# Patient Record
Sex: Female | Born: 1955 | Race: Black or African American | Hispanic: No | Marital: Married | Smoking: Never smoker
Health system: Southern US, Community
[De-identification: ages and names within clinical notes are randomized; demographics above are authoritative.]

## PROBLEM LIST (undated history)

## (undated) DIAGNOSIS — I10 Essential (primary) hypertension: Secondary | ICD-10-CM

---

## 2000-12-30 ENCOUNTER — Other Ambulatory Visit: Admission: RE | Admit: 2000-12-30 | Discharge: 2000-12-30 | Payer: Self-pay | Admitting: Obstetrics and Gynecology

## 2005-10-14 ENCOUNTER — Observation Stay (HOSPITAL_COMMUNITY): Admission: RE | Admit: 2005-10-14 | Discharge: 2005-10-15 | Payer: Self-pay | Admitting: Obstetrics and Gynecology

## 2005-11-02 ENCOUNTER — Emergency Department (HOSPITAL_COMMUNITY): Admission: EM | Admit: 2005-11-02 | Discharge: 2005-11-02 | Payer: Self-pay | Admitting: Emergency Medicine

## 2018-02-25 ENCOUNTER — Emergency Department (HOSPITAL_COMMUNITY)
Admission: EM | Admit: 2018-02-25 | Discharge: 2018-02-25 | Disposition: A | Discharge status: Home / Self Care | Payer: Self-pay | Attending: Emergency Medicine | Admitting: Emergency Medicine

## 2018-02-25 ENCOUNTER — Other Ambulatory Visit: Payer: Self-pay

## 2018-02-25 ENCOUNTER — Emergency Department (HOSPITAL_COMMUNITY): Payer: Self-pay

## 2018-02-25 ENCOUNTER — Encounter (HOSPITAL_COMMUNITY): Payer: Self-pay | Admitting: Emergency Medicine

## 2018-02-25 DIAGNOSIS — M5441 Lumbago with sciatica, right side: Secondary | ICD-10-CM | POA: Insufficient documentation

## 2018-02-25 DIAGNOSIS — I10 Essential (primary) hypertension: Secondary | ICD-10-CM | POA: Insufficient documentation

## 2018-02-25 HISTORY — DX: Essential (primary) hypertension: I10

## 2018-02-25 LAB — URINALYSIS, ROUTINE W REFLEX MICROSCOPIC
BILIRUBIN URINE: NEGATIVE
Glucose, UA: NEGATIVE mg/dL
HGB URINE DIPSTICK: NEGATIVE
KETONES UR: NEGATIVE mg/dL
Leukocytes, UA: NEGATIVE
Nitrite: NEGATIVE
PROTEIN: NEGATIVE mg/dL
SPECIFIC GRAVITY, URINE: 1.012 (ref 1.005–1.030)
pH: 8 (ref 5.0–8.0)

## 2018-02-25 MED ORDER — CYCLOBENZAPRINE HCL 10 MG PO TABS
10.0000 mg | ORAL_TABLET | Freq: Once | ORAL | Status: AC
  Administered 2018-02-25: 10 mg via ORAL
  Filled 2018-02-25: qty 1

## 2018-02-25 MED ORDER — CYCLOBENZAPRINE HCL 10 MG PO TABS: 10.0000 mg | ORAL_TABLET | Freq: Three times a day (TID) | ORAL | 0 refills | Status: AC | PRN

## 2018-02-25 MED ORDER — PREDNISONE 10 MG PO TABS: ORAL_TABLET | ORAL | 0 refills | Status: AC

## 2018-02-25 MED ORDER — DEXAMETHASONE SODIUM PHOSPHATE 4 MG/ML IJ SOLN
10.0000 mg | Freq: Once | INTRAMUSCULAR | Status: AC
  Administered 2018-02-25: 10 mg via INTRAMUSCULAR
  Filled 2018-02-25: qty 3

## 2018-02-25 NOTE — ED Notes (Signed)
Patient seen and evaluated by EDPa for initial assessment. 

## 2018-02-25 NOTE — Discharge Instructions (Addendum)
Alternate ice and heat to your lower back.  Try to avoid excessive bending and twisting movements.  Return to the ER for any worsening symptoms such as fever, chills,  incontinence, and increasing pain or numbness of your legs.

## 2018-02-25 NOTE — ED Triage Notes (Addendum)
Pt c/o of lower back pain with left leg pain.  HX of lower back pain

## 2018-02-28 NOTE — ED Provider Notes (Signed)
[facility] EMERGENCY DEPARTMENT Provider Note   CSN: [id number] Arrival date & time: 02/25/18  1548     History   Chief Complaint Chief Complaint  Patient presents with  . Back Pain    HPI Megan Mccall is a 62 y.o. female.  HPI   Megan Mccall is a 62 y.o. female with hx of low back pain, presents to the Emergency Department complaining of low back pain with pain radiating into the right leg.  Pain worsening for several days.  Pain is similar to previous and associated with movement.  Improves slightly at rest.  Denies recent injury or new symptoms.  No abdominal pain, urine or bowel changes, fever, numbness or weakness of the lower extremities.      Past Medical History:  Diagnosis Date  . Hypertension     There are no active problems to display for this patient.   Past Surgical History:  Procedure Laterality Date  . CESAREAN SECTION       OB History   None      Home Medications    Prior to Admission medications   Medication Sig Start Date End Date Taking? Authorizing Provider  cyclobenzaprine (FLEXERIL) 10 MG tablet Take 1 tablet (10 mg total) by mouth 3 (three) times daily as needed. 02/25/18   Amparo Donalson, PA-C  predniSONE (DELTASONE) 10 MG tablet Take 6 tablets day one, 5 tablets day two, 4 tablets day three, 3 tablets day four, 2 tablets day five, then 1 tablet day six 02/25/18   Pauline Aus, PA-C    Family History No family history on file.  Social History Social History   Tobacco Use  . Smoking status: Never Smoker  . Smokeless tobacco: Never Used  Substance Use Topics  . Alcohol use: Never    Frequency: Never  . Drug use: Never     Allergies   Patient has no allergy information on record.   Review of Systems Review of Systems  Constitutional: Negative for fever.  Respiratory: Negative for shortness of breath.   Gastrointestinal: Negative for abdominal pain, constipation and vomiting.  Genitourinary: Negative  for decreased urine volume, difficulty urinating, dysuria and flank pain.  Musculoskeletal: Positive for back pain. Negative for joint swelling.  Skin: Negative for rash.  Neurological: Negative for weakness and numbness.  All other systems reviewed and are negative.    Physical Exam Updated Vital Signs BP (!) 161/95 (BP Location: Right Arm)   Pulse 75   Temp 98.5 F (36.9 C) (Oral)   Resp 20   Ht 5\' 6"  (1.676 m)   Wt 88.5 kg (195 lb)   SpO2 99%   BMI 31.47 kg/m   Physical Exam  Constitutional: She is oriented to person, place, and time. She appears well-developed and well-nourished. No distress.  HENT:  Head: Normocephalic and atraumatic.  Neck: Normal range of motion. Neck supple.  Cardiovascular: Normal rate, regular rhythm and intact distal pulses.  DP pulses are strong and palpable bilaterally  Pulmonary/Chest: Effort normal and breath sounds normal. No respiratory distress.  Abdominal: Soft. She exhibits no distension. There is no tenderness.  Musculoskeletal: She exhibits tenderness. She exhibits no edema.       Lumbar back: She exhibits tenderness and pain. She exhibits normal range of motion, no swelling, no deformity, no laceration and normal pulse.  ttp of the lower right lumbar paraspinal muscles and SI joint space.  No spinal tenderness.  Pt has 5/5 strength against resistance of bilateral lower  extremities.     Neurological: She is alert and oriented to person, place, and time. She has normal strength. No sensory deficit. She exhibits normal muscle tone. Coordination and gait normal.  Reflex Scores:      Patellar reflexes are 2+ on the right side and 2+ on the left side.      Achilles reflexes are 2+ on the right side and 2+ on the left side. Skin: Skin is warm and dry. Capillary refill takes less than 2 seconds. No rash noted.  Nursing note and vitals reviewed.    ED Treatments / Results  Labs (all labs ordered are listed, but only abnormal results are  displayed) Labs Reviewed  URINALYSIS, ROUTINE W REFLEX MICROSCOPIC - Abnormal; Notable for the following components:      Result Value   APPearance CLOUDY (*)    All other components within normal limits    EKG None  Radiology No results found.  Procedures Procedures (including critical care time)  Medications Ordered in ED Medications  dexamethasone (DECADRON) injection 10 mg (10 mg Intramuscular Given 02/25/18 1717)  cyclobenzaprine (FLEXERIL) tablet 10 mg (10 mg Oral Given 02/25/18 1716)     Initial Impression / Assessment and Plan / ED Course  I have reviewed the triage vital signs and the nursing notes.  Pertinent labs & imaging results that were available during my care of the patient were reviewed by me and considered in my medical decision making (see chart for details).     Pt well appearing, ambulates with steady gait.  No focal neuro deficits.  Likely acute on chronic low back pain with sciatica.  Pt agrees to plan with close PCP f/u if needed and rx flexeril and prednisone.   Final Clinical Impressions(s) / ED Diagnoses   Final diagnoses:  Acute right-sided low back pain with right-sided sciatica    ED Discharge Orders        Ordered    cyclobenzaprine (FLEXERIL) 10 MG tablet  3 times daily PRN     02/25/18 1913    predniSONE (DELTASONE) 10 MG tablet     02/25/18 1913       Pauline Ausriplett, Caoimhe Damron, PA-C 02/28/18 2327    Bethann BerkshireZammit, Joseph, MD 03/01/18 1620

## 2019-10-01 IMAGING — DX DG LUMBAR SPINE COMPLETE 4+V
5 series · 5 of 5 positions shown · non-contrast
Comparison: None.

CLINICAL DATA: Lower back pain worsening since 02/08/2018.

EXAM:
LUMBAR SPINE - COMPLETE 4+ VIEW

[l-spine ap]
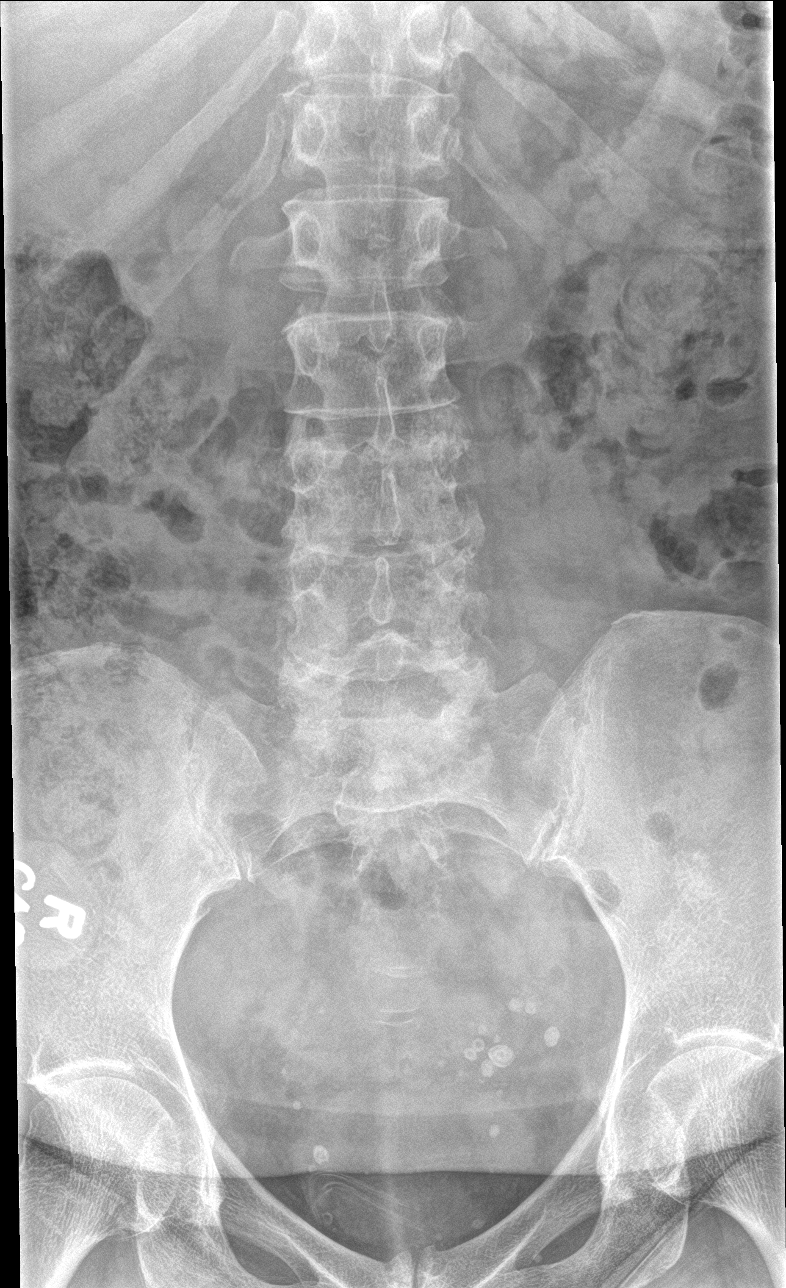

[l-spine obl (1 of 2)]
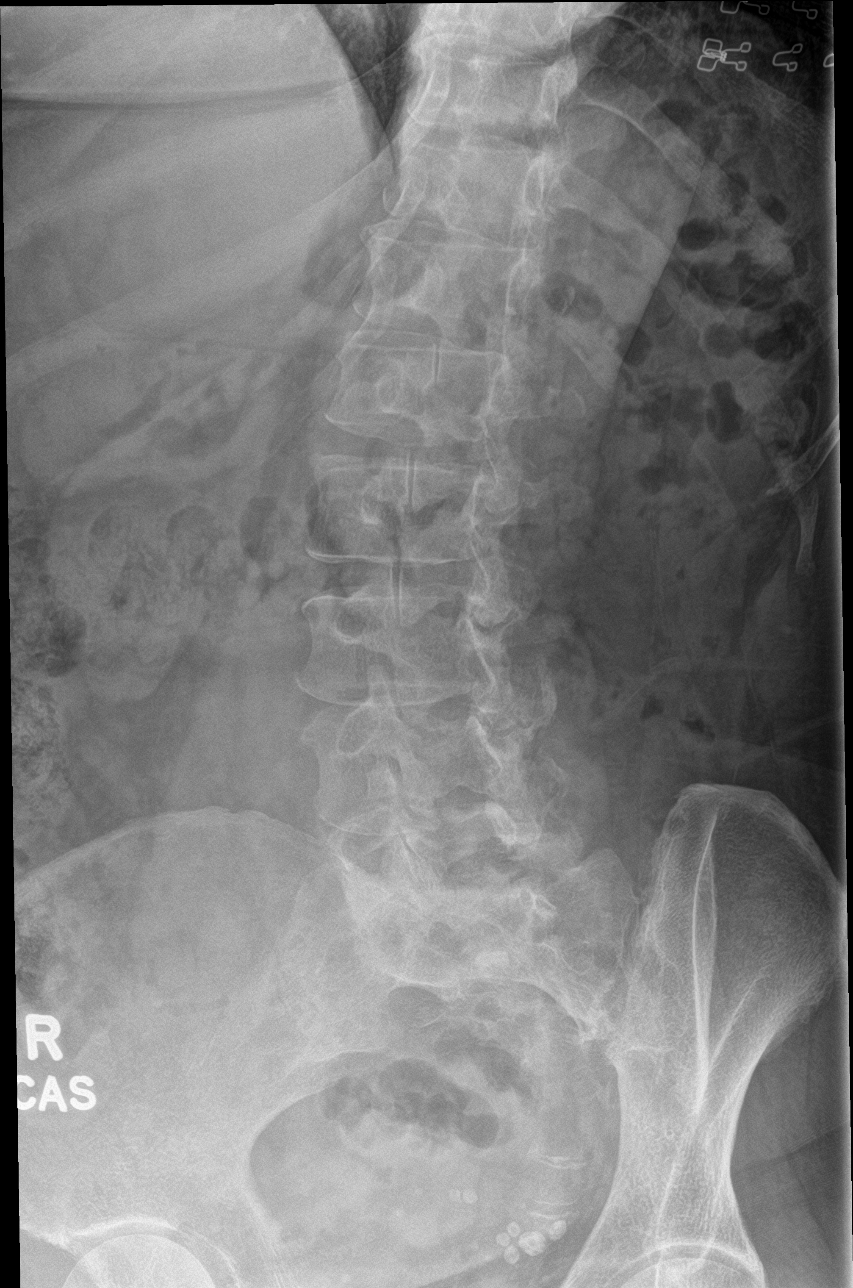

[l-spine obl (2 of 2)]
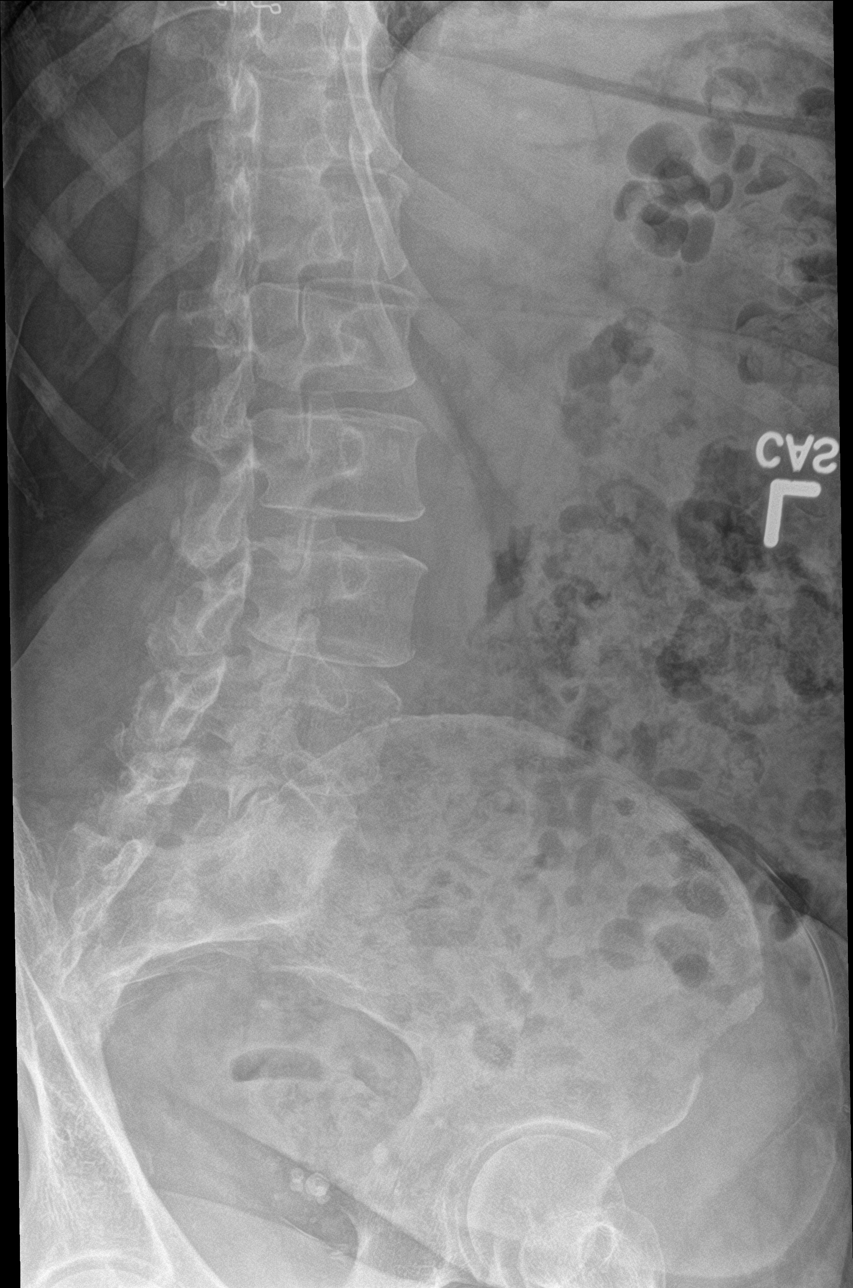

[l-spine lat]
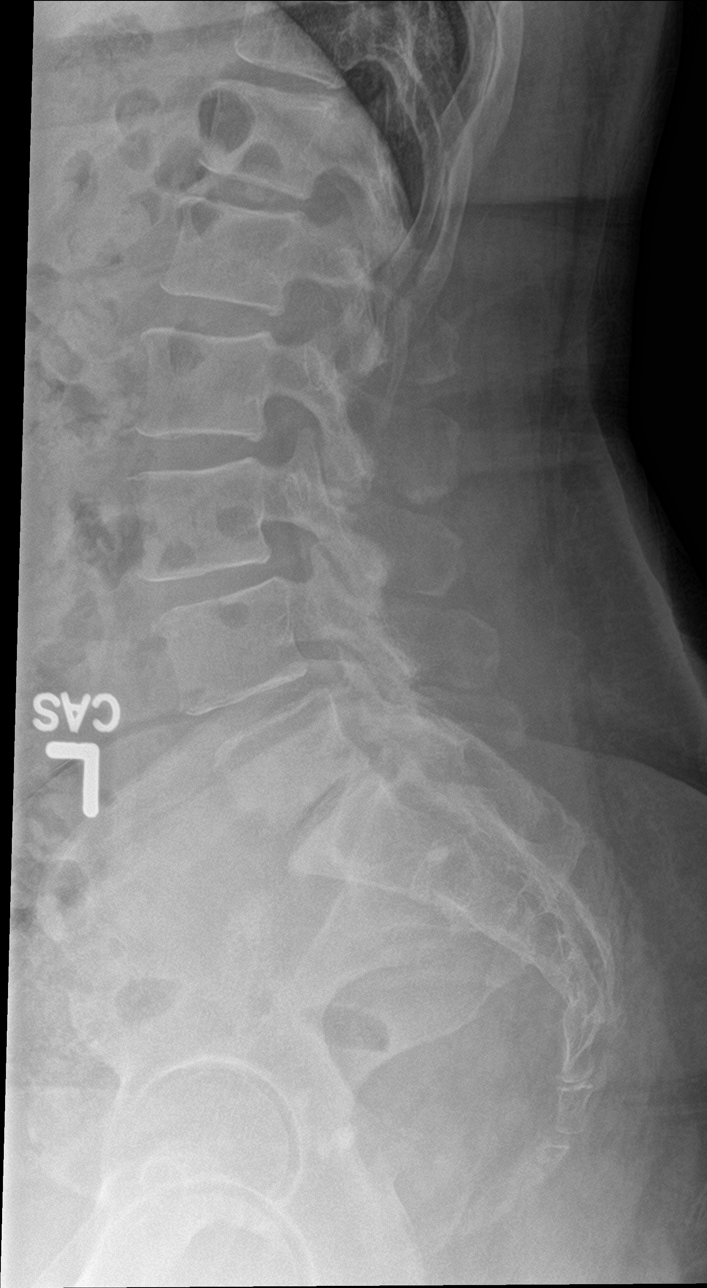

[l-spine spot]
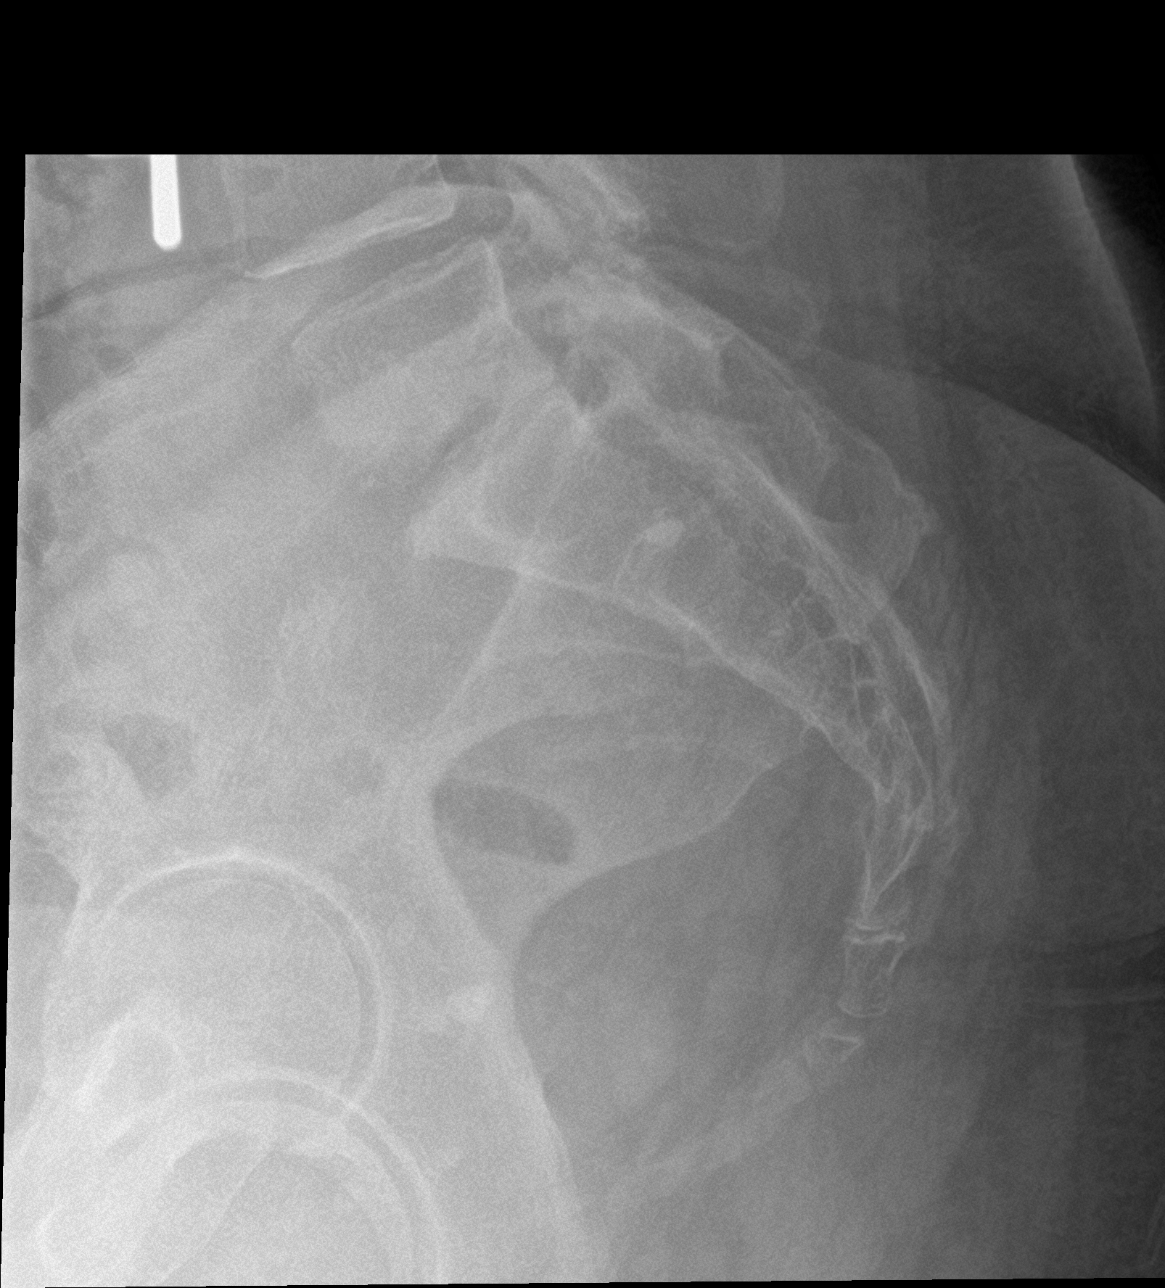

[5 of 5 positions shown; findings below may reference images not displayed]

FINDINGS: There is no evidence of lumbar spine fracture. There are 5 non
ribbed lumbar vertebrae in normal lumbar lordosis. Facet arthropathy
with joint space narrowing and hypertrophy is identified from L2
through S1. Moderate disc space narrowing seen at L5-S1 with vacuum
disc phenomena. No pars defects or listhesis. No acute fracture. The
included sacroiliac joints, pubic symphysis and hip joints are
nonacute.
IMPRESSION: Degenerative disc disease L5-S1. Facet arthropathy L2 through
S1.Negative for acute fracture.
